# Patient Record
Sex: Male | Born: 2000 | Race: Black or African American | Hispanic: No | Marital: Single | Smoking: Never smoker
Health system: Southern US, Community
[De-identification: ages and names within clinical notes are randomized; demographics above are authoritative.]

## PROBLEM LIST (undated history)

## (undated) DIAGNOSIS — J302 Other seasonal allergic rhinitis: Secondary | ICD-10-CM

---
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
## 2022-05-17 ENCOUNTER — Emergency Department (HOSPITAL_COMMUNITY)
Admission: EM | Admit: 2022-05-17 | Discharge: 2022-05-17 | Disposition: A | Discharge status: Home / Self Care | Payer: Medicaid Other | Attending: Emergency Medicine | Admitting: Emergency Medicine

## 2022-05-17 ENCOUNTER — Emergency Department (HOSPITAL_COMMUNITY): Payer: Medicaid Other

## 2022-05-17 DIAGNOSIS — R109 Unspecified abdominal pain: Secondary | ICD-10-CM | POA: Diagnosis present

## 2022-05-17 DIAGNOSIS — N132 Hydronephrosis with renal and ureteral calculous obstruction: Secondary | ICD-10-CM | POA: Diagnosis not present

## 2022-05-17 LAB — COMPREHENSIVE METABOLIC PANEL
ALT: 21 U/L (ref 0–44)
AST: 25 U/L (ref 15–41)
Albumin: 3.7 g/dL (ref 3.5–5.0)
Alkaline Phosphatase: 88 U/L (ref 38–126)
Anion gap: 10 (ref 5–15)
BUN: 5 mg/dL — ABNORMAL LOW (ref 6–20)
CO2: 23 mmol/L (ref 22–32)
Calcium: 9.4 mg/dL (ref 8.9–10.3)
Chloride: 104 mmol/L (ref 98–111)
Creatinine, Ser: 1.27 mg/dL — ABNORMAL HIGH (ref 0.61–1.24)
GFR, Estimated: >60 mL/min (ref >60)
Glucose, Bld: 119 mg/dL — ABNORMAL HIGH (ref 70–99)
Potassium: 4.1 mmol/L (ref 3.5–5.1)
Sodium: 137 mmol/L (ref 135–145)
Total Bilirubin: 0.5 mg/dL (ref 0.3–1.2)
Total Protein: 7 g/dL (ref 6.5–8.1)

## 2022-05-17 LAB — CBC WITH DIFFERENTIAL/PLATELET
Abs Immature Granulocytes: 0.04 10*3/uL (ref 0.00–0.07)
Basophils Absolute: 0 10*3/uL (ref 0.0–0.1)
Basophils Relative: 0 %
Eosinophils Absolute: 0 10*3/uL (ref 0.0–0.5)
Eosinophils Relative: 0 %
HCT: 44 % (ref 39.0–52.0)
Hemoglobin: 15.2 g/dL (ref 13.0–17.0)
Immature Granulocytes: 0 %
Lymphocytes Relative: 11 %
Lymphs Abs: 1.1 10*3/uL (ref 0.7–4.0)
MCH: 31.9 pg (ref 26.0–34.0)
MCHC: 34.5 g/dL (ref 30.0–36.0)
MCV: 92.4 fL (ref 80.0–100.0)
Monocytes Absolute: 0.7 10*3/uL (ref 0.1–1.0)
Monocytes Relative: 7 %
Neutro Abs: 8.5 10*3/uL — ABNORMAL HIGH (ref 1.7–7.7)
Neutrophils Relative %: 82 %
Platelets: 270 10*3/uL (ref 150–400)
RBC: 4.76 MIL/uL (ref 4.22–5.81)
RDW: 10.8 % — ABNORMAL LOW (ref 11.5–15.5)
WBC: 10.5 10*3/uL (ref 4.0–10.5)
nRBC: 0 % (ref 0.0–0.2)

## 2022-05-17 LAB — URINALYSIS, ROUTINE W REFLEX MICROSCOPIC
Bilirubin Urine: NEGATIVE
Glucose, UA: NEGATIVE mg/dL
Ketones, ur: NEGATIVE mg/dL
Leukocytes,Ua: NEGATIVE
Nitrite: NEGATIVE
Protein, ur: NEGATIVE mg/dL
Specific Gravity, Urine: 1.015 (ref 1.005–1.030)
pH: 8 (ref 5.0–8.0)

## 2022-05-17 LAB — LIPASE, BLOOD: Lipase: 42 U/L (ref 11–51)

## 2022-05-17 LAB — URINALYSIS, MICROSCOPIC (REFLEX)

## 2022-05-17 MED ORDER — IOHEXOL 350 MG/ML SOLN
75.0000 mL | Freq: Once | INTRAVENOUS | Status: AC | PRN
  Administered 2022-05-17: 75 mL via INTRAVENOUS

## 2022-05-17 MED ORDER — ONDANSETRON HCL 4 MG/2ML IJ SOLN
4.0000 mg | Freq: Once | INTRAMUSCULAR | Status: AC
  Administered 2022-05-17: 4 mg via INTRAVENOUS
  Filled 2022-05-17: qty 2

## 2022-05-17 MED ORDER — ONDANSETRON HCL 4 MG PO TABS: 4.0000 mg | ORAL_TABLET | Freq: Four times a day (QID) | ORAL | 0 refills | Status: AC

## 2022-05-17 MED ORDER — TAMSULOSIN HCL 0.4 MG PO CAPS: 0.4000 mg | ORAL_CAPSULE | Freq: Every day | ORAL | 0 refills | Status: AC

## 2022-05-17 MED ORDER — SODIUM CHLORIDE 0.9 % IV BOLUS
1000.0000 mL | Freq: Once | INTRAVENOUS | Status: AC
  Administered 2022-05-17: 1000 mL via INTRAVENOUS

## 2022-05-17 MED ORDER — HYDROCODONE-ACETAMINOPHEN 5-325 MG PO TABS: 1.0000 | ORAL_TABLET | Freq: Four times a day (QID) | ORAL | 0 refills | Status: AC | PRN

## 2022-05-17 MED ORDER — MORPHINE SULFATE (PF) 2 MG/ML IV SOLN
2.0000 mg | Freq: Once | INTRAVENOUS | Status: AC
  Administered 2022-05-17: 2 mg via INTRAVENOUS
  Filled 2022-05-17: qty 1

## 2022-05-17 NOTE — ED Provider Notes (Signed)
[facility] Provider Note   CSN: [id number] Arrival date & time: 05/17/22  [id number]     History  Chief Complaint  Patient presents with   Abdominal Pain    Brandon Bush is a 22 y.o. male who presents emergency department with concerns for abdominal pain that started today.  Voices concern for his pancreas due to pancreas issues running in his family.  No alcohol or drug use.  Has associated nausea, vomiting.  No meds tried prior to arrival.  Denies diarrhea, constipation, urinary symptoms.  The history is provided by the patient. No language interpreter was used.       Home Medications Prior to Admission medications   Medication Sig Start Date End Date Taking? Authorizing Provider  HYDROcodone-acetaminophen (NORCO/VICODIN) 5-325 MG tablet Take 1 tablet by mouth every 6 (six) hours as needed. 05/17/22  Yes Daijon Wenke A, PA-C  ondansetron (ZOFRAN) 4 MG tablet Take 1 tablet (4 mg total) by mouth every 6 (six) hours. 05/17/22  Yes Effie Wahlert A, PA-C  tamsulosin (FLOMAX) 0.4 MG CAPS capsule Take 1 capsule (0.4 mg total) by mouth daily. 05/17/22  Yes Abed Schar A, PA-C  ibuprofen (ADVIL) 800 MG tablet Take 1 tablet (800 mg total) by mouth 3 (three) times daily. 03/01/20   Larene Pickett, PA-C  ondansetron (ZOFRAN ODT) 4 MG disintegrating tablet Take 1 tablet (4 mg total) by mouth every 8 (eight) hours as needed for nausea. 03/01/20   Larene Pickett, PA-C      Allergies    Patient has no known allergies.    Review of Systems   Review of Systems  Gastrointestinal:  Positive for abdominal pain.  All other systems reviewed and are negative.   Physical Exam Updated Vital Signs BP 116/62 (BP Location: Right Arm)   Pulse 87   Temp 97.6 F (36.4 C) (Oral)   Resp 15   SpO2 99%  Physical Exam Vitals and nursing note reviewed.  Constitutional:      General: He is not in acute distress.    Appearance: He is not diaphoretic.  HENT:      Head: Normocephalic and atraumatic.     Mouth/Throat:     Pharynx: No oropharyngeal exudate.  Eyes:     General: No scleral icterus.    Conjunctiva/sclera: Conjunctivae normal.  Cardiovascular:     Rate and Rhythm: Normal rate and regular rhythm.     Pulses: Normal pulses.     Heart sounds: Normal heart sounds.  Pulmonary:     Effort: Pulmonary effort is normal. No respiratory distress.     Breath sounds: Normal breath sounds. No wheezing.  Abdominal:     General: Bowel sounds are normal.     Palpations: Abdomen is soft. There is no mass.     Tenderness: There is no abdominal tenderness. There is no guarding or rebound.     Comments: TTP noted to right sided abdomen.   Musculoskeletal:        General: Normal range of motion.     Cervical back: Normal range of motion and neck supple.  Skin:    General: Skin is warm and dry.  Neurological:     Mental Status: He is alert.  Psychiatric:        Behavior: Behavior normal.     ED Results / Procedures / Treatments   Labs (all labs ordered are listed, but only abnormal results are displayed) Labs Reviewed  CBC  WITH DIFFERENTIAL/PLATELET - Abnormal; Notable for the following components:      Result Value   RDW 10.8 (*)    Neutro Abs 8.5 (*)    All other components within normal limits  COMPREHENSIVE METABOLIC PANEL - Abnormal; Notable for the following components:   Glucose, Bld 119 (*)    BUN 5 (*)    Creatinine, Ser 1.27 (*)    All other components within normal limits  URINALYSIS, ROUTINE W REFLEX MICROSCOPIC - Abnormal; Notable for the following components:   Hgb urine dipstick LARGE (*)    All other components within normal limits  URINALYSIS, MICROSCOPIC (REFLEX) - Abnormal; Notable for the following components:   Bacteria, UA RARE (*)    All other components within normal limits  LIPASE, BLOOD    EKG None  Radiology CT ABDOMEN PELVIS W CONTRAST  Result Date: 05/17/2022 CLINICAL DATA:  Right lower quadrant  pain. EXAM: CT ABDOMEN AND PELVIS WITH CONTRAST TECHNIQUE: Multidetector CT imaging of the abdomen and pelvis was performed using the standard protocol following bolus administration of intravenous contrast. RADIATION DOSE REDUCTION: This exam was performed according to the departmental dose-optimization program which includes automated exposure control, adjustment of the mA and/or kV according to patient size and/or use of iterative reconstruction technique. CONTRAST:  46m OMNIPAQUE IOHEXOL 350 MG/ML SOLN COMPARISON:  03/01/2020 FINDINGS: Lower Chest: No acute findings. Hepatobiliary: No hepatic masses identified. Gallbladder is unremarkable. No evidence of biliary ductal dilatation. Pancreas:  No mass or inflammatory changes. Spleen: Within normal limits in size and appearance. Adrenals/Urinary Tract: Mild right hydroureteronephrosis is seen due to a 2 mm calculus in the mid pelvic portion of the right ureter. No evidence of renal mass. Unremarkable unopacified urinary bladder. Stomach/Bowel: No evidence of obstruction, inflammatory process or abnormal fluid collections. Normal appendix visualized. Vascular/Lymphatic: No pathologically enlarged lymph nodes. No acute vascular findings. Reproductive:  No mass or other significant abnormality. Other:  None. Musculoskeletal:  No suspicious bone lesions identified. IMPRESSION: Mild right hydroureteronephrosis due to 2 mm calculus in mid pelvic portion of right ureter. Electronically Signed   By: JMarlaine HindM.D.   On: 05/17/2022 12:22    Procedures Procedures    Medications Ordered in ED Medications  sodium chloride 0.9 % bolus 1,000 mL (0 mLs Intravenous Stopped 05/17/22 1130)  ondansetron (ZOFRAN) injection 4 mg (4 mg Intravenous Given 05/17/22 1046)  morphine (PF) 2 MG/ML injection 2 mg (2 mg Intravenous Given 05/17/22 1047)  iohexol (OMNIPAQUE) 350 MG/ML injection 75 mL (75 mLs Intravenous Contrast Given 05/17/22 1206)    ED Course/ Medical Decision  Making/ A&P Clinical Course as of 05/17/22 1316  Mon May 17, 2022  1111 Re-evaluated and laying comfortably on stretcher. Pt with improvement of symptoms with treatment regimen in the ED.  [SB]  1310 Patient reevaluated and asleep on stretcher.  Discussed with patient CT scan findings. Answered all available questions. Pt appears safe for discharge at this time.  [SB]    Clinical Course User Index [SB] Mychael Soots A, PA-C                             Medical Decision Making Amount and/or Complexity of Data Reviewed Labs: ordered. Radiology: ordered.  Risk Prescription drug management.   Patient presents to the emergency department with right-sided abdominal pain onset this morning.  Still has his gallbladder and appendix.  On exam patient with tenderness to palpation noted to right sided  abdomen.  No acute cardiovascular respiratory exam findings.  Patient afebrile.  Differential diagnosis includes cholecystitis, appendicitis, pyelonephritis, nephrolithiasis, acute cystitis.   Labs:  I ordered, and personally interpreted labs.  The pertinent results include:  Urine notable for large amount of hemoglobin otherwise nitrate and leukocyte negative. Lipase unremarkable. CMP with slightly elevated creatinine at 1.27 otherwise unremarkable. CBC unremarkable  Imaging: I ordered imaging studies including CT abdomen pelvis with I independently visualized and interpreted imaging which showed  Mild right hydroureteronephrosis due to 2 mm calculus in mid pelvic  portion of right ureter.   I agree with the radiologist interpretation  Medications:  I ordered medication including IVF, Zofran, and Morphine for pain/symptom management.  Reevaluation of the patient after these medicines and interventions, I reevaluated the patient and found that they have improved I have reviewed the patients home medicines and have made adjustments as needed   Disposition: Presenting suspicious for  ureteral stone.  Doubt concerns at this time for cholecystitis, appendicitis, acute cystitis, pyelonephritis. After consideration of the diagnostic results and the patients response to treatment, I feel that the patient would benefit from Discharge home.  Patient discharge to home with a prescription for Flomax and Zofran.  PDMP reviewed.  Short course of Norco sent to patient's pharmacy.  Attached information for on-call urology group for follow-up regarding today's ED visit.  Patient instructed to follow-up with primary care provider.  Work note provided.  Supportive care measures and strict return precautions discussed with patient at bedside. Pt acknowledges and verbalizes understanding. Pt appears safe for discharge. Follow up as indicated in discharge paperwork.   This chart was dictated using voice recognition software, Dragon. Despite the best efforts of this provider to proofread and correct errors, errors may still occur which can change documentation meaning.   Final Clinical Impression(s) / ED Diagnoses Final diagnoses:  Ureteral stone with hydronephrosis    Rx / DC Orders ED Discharge Orders          Ordered    tamsulosin (FLOMAX) 0.4 MG CAPS capsule  Daily        05/17/22 1312    ondansetron (ZOFRAN) 4 MG tablet  Every 6 hours        05/17/22 1312    HYDROcodone-acetaminophen (NORCO/VICODIN) 5-325 MG tablet  Every 6 hours PRN        05/17/22 1312              Akeyla Molden A, PA-C 05/17/22 1316    Carmin Muskrat, MD 05/17/22 1510

## 2022-05-17 NOTE — ED Triage Notes (Signed)
Abdominal pain that started today with nausea, vomiting. Pancreas problems run in the family history. Denies using any alcohol and drugs.

## 2022-05-17 NOTE — Discharge Instructions (Addendum)
It was a pleasure to take care of you tonight!  Your urinalysis didn't show infection, it did show concerns for blood in the urine likely due to passing a kidney stone. Your CT scan showed a kidney stone that is in the process of passing. You will be sent a prescription for Flomax, Zofran, Norco, take these medications as prescribed.  Attached is information for the on-call urologist, you may call and set up a follow-up appointment regarding today's ED visit.  You may follow-up with your primary care provider regarding today's ED visit.  Return to the ED if you are experiencing increasing/worsening back pain, vomiting, fever, abdominal pain, worsening symptoms.

## 2022-05-17 NOTE — ED Notes (Signed)
Taken to CT by CT tech.
